# Patient Record
Sex: Female | Born: 1995 | Hispanic: Yes | Marital: Single | Smoking: Former smoker
Health system: Southern US, Community
[De-identification: ages and names within clinical notes are randomized; demographics above are authoritative.]

## PROBLEM LIST (undated history)

## (undated) DIAGNOSIS — F32A Depression, unspecified: Secondary | ICD-10-CM

## (undated) DIAGNOSIS — F329 Major depressive disorder, single episode, unspecified: Secondary | ICD-10-CM

## (undated) DIAGNOSIS — F419 Anxiety disorder, unspecified: Secondary | ICD-10-CM

## (undated) HISTORY — DX: Depression, unspecified: F32.A

## (undated) HISTORY — DX: Anxiety disorder, unspecified: F41.9

## (undated) HISTORY — DX: Major depressive disorder, single episode, unspecified: F32.9

---
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
## 2017-07-12 ENCOUNTER — Encounter: Payer: Self-pay | Admitting: Family Medicine

## 2017-07-12 ENCOUNTER — Ambulatory Visit (INDEPENDENT_AMBULATORY_CARE_PROVIDER_SITE_OTHER): Payer: BLUE CROSS/BLUE SHIELD | Admitting: Family Medicine

## 2017-07-12 VITALS — BP 98/70 | HR 68 | Resp 12 | Ht 65.0 in | Wt 117.0 lb

## 2017-07-12 DIAGNOSIS — N920 Excessive and frequent menstruation with regular cycle: Secondary | ICD-10-CM | POA: Diagnosis not present

## 2017-07-12 DIAGNOSIS — G4700 Insomnia, unspecified: Secondary | ICD-10-CM | POA: Diagnosis not present

## 2017-07-12 DIAGNOSIS — Z114 Encounter for screening for human immunodeficiency virus [HIV]: Secondary | ICD-10-CM

## 2017-07-12 DIAGNOSIS — F339 Major depressive disorder, recurrent, unspecified: Secondary | ICD-10-CM | POA: Insufficient documentation

## 2017-07-12 DIAGNOSIS — F331 Major depressive disorder, recurrent, moderate: Secondary | ICD-10-CM

## 2017-07-12 LAB — CBC
HCT: 42.8 % (ref 36.0–49.0)
Hemoglobin: 14.3 g/dL (ref 12.0–16.0)
MCHC: 33.4 g/dL (ref 31.0–37.0)
MCV: 92.3 fl (ref 78.0–98.0)
Platelets: 185 10*3/uL (ref 150.0–575.0)
RBC: 4.63 Mil/uL (ref 3.80–5.70)
RDW: 12.6 % (ref 11.4–15.5)
WBC: 6 10*3/uL (ref 4.5–13.5)

## 2017-07-12 LAB — TSH: TSH: 2.96 u[IU]/mL (ref 0.40–5.00)

## 2017-07-12 MED ORDER — ESCITALOPRAM OXALATE 10 MG PO TABS: 10.0000 mg | ORAL_TABLET | Freq: Every day | ORAL | 1 refills | Status: DC

## 2017-07-12 MED ORDER — MELATONIN ER 5 MG PO TBCR: 5.0000 mg | EXTENDED_RELEASE_TABLET | Freq: Every day | ORAL | 1 refills | Status: DC

## 2017-07-12 NOTE — Patient Instructions (Signed)
A few things to remember from today's visit:   Menorrhagia with regular cycle - Plan: CBC, TSH  Moderate episode of recurrent major depressive disorder (HCC) - Plan: escitalopram (LEXAPRO) 10 MG tablet  Encounter for screening for HIV - Plan: HIV antibody (with reflex)  Today we started Lexapro, this type of medications can increase suicidal risk. This is more prevalent among children,adolecents, and young adults with major depression or other psychiatric disorders. It can also make depression worse. Most common side effects are gastrointestinal, self limited after a few weeks: diarrhea, nausea, constipation  Or diarrhea among some.  In general it is well tolerated. We will follow closely.  In about 3 weeks please let me know through My Chart or by calling the office about tolerance of new medication.      Please be sure medication list is accurate. If a new problem present, please set up appointment sooner than planned today.

## 2017-07-12 NOTE — Progress Notes (Signed)
HPI:   [name] is a 21 y.o. female, who is here today to establish care.  Former PCP: N/A Last preventive routine visit: 2-3 years ago.  Chronic medical problems: Depression,anxiety.   Concerns today: She would like to discuss pharmacologic treatment for anxiety and depression. According to patient, about 6 months ago she was prescribed 2 medications to help with these problems, she took medication for about a month. Hydroxyzine and Sertraline (recommended for anxiety and depression respectively. She does not recall major side effects except for drowsiness caused by Hydroxyzine. Counseling was recommended but she did not arranged visit due to busy school schedule.  She denies suicidal thoughts but wonders if she was better off dead and if somebody would care is something happens to her. She has not discussed these problems with her parents, according to patient, they don't believe in medications for depression or anxiety.  + Insomnia, she is afraid of darkness, so sleeps with lights on. She states that if she does not have lights on in her room she has more trouble falling asleep and wakes up in the middle of the night seeing bugs crawling on her or other insects in the room. This does not happen when light is on. She denies other type of hallucinations during the day. She usually sleeps about 7 hours, goes to bed between 1:30 am and 2 am while she stays with her parents and around 4 am when she is in school.  She lives with her mother ans younger sister during school breaks. Her parents are divorce, she has a good relation with her mother and sees her father weekly.  No known history of psychiatric problems in her family but she thinks her parents have some anxiety issues.  She is also requesting labs done, states that her sister was recently dx with anemia. + Heavy menses.  She is on OCP's, which she takes for contraception. She is not in a relationship now,  states that she has sexual intercourse/encouters randomly, sometimes with people she just met. Recently Dx with chlamydia vaginitis, treated. She does not use condoms.   In general that she follows a healthy diet and exercises regularly.  Review of Systems  Constitutional: Negative for activity change, appetite change, fatigue, fever and unexpected weight change.  HENT: Negative for mouth sores, nosebleeds and trouble swallowing.   Eyes: Negative for redness and visual disturbance.  Respiratory: Negative for cough, shortness of breath and wheezing.   Cardiovascular: Negative for chest pain, palpitations and leg swelling.  Gastrointestinal: Negative for abdominal pain, nausea and vomiting.       Negative for changes in bowel habits.  Endocrine: Negative for cold intolerance and heat intolerance.  Genitourinary: Negative for decreased urine volume, dysuria, genital sores, hematuria, vaginal bleeding and vaginal discharge.  Musculoskeletal: Negative for arthralgias, gait problem and myalgias.  Skin: Negative for pallor and rash.  Neurological: Negative for syncope, weakness and headaches.  Hematological: Negative for adenopathy. Does not bruise/bleed easily.  Psychiatric/Behavioral: Positive for hallucinations and sleep disturbance. Negative for confusion. The patient is nervous/anxious.     No current outpatient prescriptions on file prior to visit.   No current facility-administered medications on file prior to visit.    Past Medical History:  Diagnosis Date  . Anxiety   . Depression    No Known Allergies  Family History  Problem Relation Age of Onset  . Arthritis Mother   . Diabetes Maternal Grandmother     Social  History   Social History  . Marital status: Single    Spouse name: N/A  . Number of children: N/A  . Years of education: N/A   Social History Main Topics  . Smoking status: Former Research scientist (life sciences)  . Smokeless tobacco: Never Used  . Alcohol use No  . Drug use: No    . Sexual activity: Yes    Birth control/ protection: Pill   Other Topics Concern  . None   Social History Narrative  . None    Vitals:   07/12/17 0729  BP: 98/70  Pulse: 68  Resp: 12   Body mass index is 19.47 kg/m.   Physical Exam  Nursing note and vitals reviewed. Constitutional: She is oriented to person, place, and time. She appears well-developed and well-nourished. No distress.  HENT:  Head: Atraumatic.  Mouth/Throat: Oropharynx is clear and moist and mucous membranes are normal.  Eyes: Pupils are equal, round, and reactive to light. Conjunctivae and EOM are normal.  Neck: No tracheal deviation present. No thyroid mass and no thyromegaly present.  Cardiovascular: Normal rate and regular rhythm.   No murmur heard. Pulses:      Dorsalis pedis pulses are 2+ on the right side, and 2+ on the left side.  Respiratory: Effort normal and breath sounds normal. No respiratory distress.  GI: Soft. She exhibits no mass. There is no hepatomegaly. There is no tenderness.  Musculoskeletal: She exhibits no edema.  Lymphadenopathy:    She has no cervical adenopathy.  Neurological: She is alert and oriented to person, place, and time. She has normal strength. Coordination normal.  Skin: Skin is warm. No erythema.  Psychiatric: Her mood appears anxious. Her affect is labile. She exhibits a depressed mood. She expresses no suicidal ideation. She expresses no suicidal plans.  Well groomed, poor eye contact.    ASSESSMENT AND PLAN:   Carmen Foster was seen today for establish care.  Diagnoses and all orders for this visit:  Lab Results  Component Value Date   WBC 6.0 07/12/2017   HGB 14.3 07/12/2017   HCT 42.8 07/12/2017   MCV 92.3 07/12/2017   PLT 185.0 07/12/2017   Lab Results  Component Value Date   TSH 2.96 07/12/2017    Insomnia, unspecified type  Possible causes discussed, ?anxiety,? Mood disorder (bipolar) among some. Good sleep hygiene recommended. Melatonin ER  recommended for now.  -     Melatonin ER 5 MG TBCR; Take 5 mg by mouth at bedtime.  Menorrhagia with regular cycle  Further recommendations will be given according to lab results. Continue OCP's.  -     CBC -     TSH  Moderate episode of recurrent major depressive disorder ([place])  After discussion of some pharmacologic treatment options, she agrees with trying Lexapro 10 mg. Side effects discussed and instructed about warning signs. Also instructed to monitor for worsening insomnia or impulsive/risky behavior. Because school schedule (goes to [employer]), she was instructed to let me know about new med tolerance and side effects through My chart and to follow in 09/2017 (fall break).  -     escitalopram (LEXAPRO) 10 MG tablet; Take 1 tablet (10 mg total) by mouth daily.  Encounter for screening for HIV  STD prevention discussed.  -     HIV antibody (with reflex)                Dayanis Bergquist G. [place], MD  [facility]. [place] office.

## 2017-07-13 LAB — HIV ANTIBODY (ROUTINE TESTING W REFLEX): HIV: NONREACTIVE

## 2017-08-29 NOTE — Progress Notes (Signed)
HPI:   [name] is a 21 y.o. female, who is here today to follow on recent OV.   She was seen on 07/12/17, c/o depression and anxiety. Lexapro 10 mg was recommended. Because she goes to [employer], I instructed her to let me know about med side effects 6 weeks after med started, she did not. She took Lexapro 1 tab and did not continue. She is not sure why she did not continue medication, she denies side effects, she feels like "it will not work." Symptoms are worse during vacation when back home and better when busier at school.  She is thinking about scheduling counseling through school.  She has not discussed her feeling with her mother, states that "she does not think it is serious." She has not seen her father since last visit.  Stress about financial issues, she does not have money for her rent this month. She has tried to discuss this issue with her father on the phone and her mother but they have not been able to help.  She is working but she is not making enough to cover all her expenses.   Sleeping better, going to bed 11-12 midnight and getting up at 7 am. She didn't take Melatonin.   She denies having suicidal ideation or having a plan but suicidal thoughts have crossed her mind.   Review of Systems  Constitutional: Positive for fatigue. Negative for activity change, appetite change and unexpected weight change.  HENT: Negative for sore throat and trouble swallowing.   Respiratory: Negative for chest tightness, shortness of breath and wheezing.   Cardiovascular: Negative for palpitations and leg swelling.  Gastrointestinal: Negative for abdominal pain, diarrhea, nausea and vomiting.  Endocrine: Negative for cold intolerance and heat intolerance.  Neurological: Negative for dizziness, seizures, syncope, weakness and headaches.  Psychiatric/Behavioral: Positive for sleep disturbance (improved.). Negative for confusion, hallucinations and self-injury. The  patient is nervous/anxious.      Current Outpatient Prescriptions on File Prior to Visit  Medication Sig Dispense Refill  . escitalopram (LEXAPRO) 10 MG tablet Take 1 tablet (10 mg total) by mouth daily. 30 tablet 1  . JUNEL FE 1/20 1-20 MG-MCG tablet Take 1 tablet by mouth daily.  2   No current facility-administered medications on file prior to visit.      Past Medical History:  Diagnosis Date  . Anxiety   . Depression    No Known Allergies  Social History   Social History  . Marital status: Single    Spouse name: N/A  . Number of children: N/A  . Years of education: N/A   Social History Main Topics  . Smoking status: Former Games developer  . Smokeless tobacco: Never Used  . Alcohol use No  . Drug use: No  . Sexual activity: Yes    Birth control/ protection: Pill   Other Topics Concern  . None   Social History Narrative  . None    Vitals:   08/30/17 1006  BP: 100/70  Pulse: 70  Resp: 12  SpO2: 99%   Body mass index is 19.7 kg/m.   Physical Exam  Nursing note and vitals reviewed. Constitutional: She is oriented to person, place, and time. She appears well-developed and well-nourished. No distress.  HENT:  Head: Normocephalic.  Mouth/Throat: Oropharynx is clear and moist and mucous membranes are normal.  Eyes: Pupils are equal, round, and reactive to light. Conjunctivae are normal.  Cardiovascular: Normal rate and regular rhythm.  No murmur heard. Respiratory: Effort normal and breath sounds normal. No respiratory distress.  Musculoskeletal: She exhibits no edema or tenderness.  Lymphadenopathy:    She has no cervical adenopathy.  Neurological: She is alert and oriented to person, place, and time. She has normal strength. Coordination and gait normal.  Skin: Skin is warm. No erythema.  Psychiatric: Her speech is normal. Her affect is labile. Cognition and memory are normal. She expresses no suicidal ideation. She expresses no suicidal plans.  Fairly  groomed, in general good eye contact.    ASSESSMENT AND PLAN:   Deborha was seen today for follow-up.  Diagnoses and all orders for this visit:  Insomnia, unspecified type  Improved. Continue good sleep hygiene.  Moderate episode of recurrent major depressive disorder (HCC)  She denies suicidal ideations or having a plan. We discussed some options, she would like to try Lexapro. Strongly recommend schedule counseling through school. We clearly discussed side effects of Lexapro and she understands if suicidal thoughts become more frequent or stronger she needs to seek immediate medical attention (ER).     25 min face to face OV. > 50% was dedicated to discussion of Dx, prognosis, treatment options, and some side effects of medication. We also discussed some options in regard to strategies to deal with family members, who according to patient, do not understand her, she does not feel supported. Also encouraged to seek counseling at [employer], they could also provide information about resources and/or options for housing. I will see her in 4 weeks (next school brake),before if needed. She voices understanding about warning signs, in which case she will call 911 or go to the ER.      Betty G. [place], MD  [facility]. [place] office.

## 2017-08-30 ENCOUNTER — Encounter: Payer: Self-pay | Admitting: Family Medicine

## 2017-08-30 ENCOUNTER — Ambulatory Visit (INDEPENDENT_AMBULATORY_CARE_PROVIDER_SITE_OTHER): Payer: BLUE CROSS/BLUE SHIELD | Admitting: Family Medicine

## 2017-08-30 VITALS — BP 100/70 | HR 70 | Resp 12 | Ht 65.0 in | Wt 118.4 lb

## 2017-08-30 DIAGNOSIS — G4700 Insomnia, unspecified: Secondary | ICD-10-CM

## 2017-08-30 DIAGNOSIS — F331 Major depressive disorder, recurrent, moderate: Secondary | ICD-10-CM

## 2017-08-30 NOTE — Patient Instructions (Signed)
A few things to remember from today's visit:   Insomnia, unspecified type  Moderate episode of recurrent major depressive disorder (HCC)  Today we started Lexapro, this type of medications can increase suicidal risk. This is more prevalent among children,adolecents, and young adults with major depression or other psychiatric disorders. It can also make depression worse. Most common side effects are gastrointestinal, self limited after a few weeks: diarrhea, nausea, constipation  Or diarrhea among some.  In general it is well tolerated. We will follow closely.      Please be sure medication list is accurate. If a new problem present, please set up appointment sooner than planned today.

## 2017-10-04 ENCOUNTER — Ambulatory Visit: Payer: BLUE CROSS/BLUE SHIELD | Admitting: Family Medicine

## 2018-05-16 ENCOUNTER — Ambulatory Visit (INDEPENDENT_AMBULATORY_CARE_PROVIDER_SITE_OTHER): Payer: Self-pay | Admitting: Family Medicine

## 2018-05-16 ENCOUNTER — Encounter: Payer: Self-pay | Admitting: Family Medicine

## 2018-05-16 VITALS — BP 110/78 | HR 67 | Temp 98.3°F | Ht 65.0 in | Wt 124.0 lb

## 2018-05-16 DIAGNOSIS — N760 Acute vaginitis: Secondary | ICD-10-CM

## 2018-05-16 DIAGNOSIS — J019 Acute sinusitis, unspecified: Secondary | ICD-10-CM

## 2018-05-16 MED ORDER — DOXYCYCLINE HYCLATE 100 MG PO CAPS: 100.0000 mg | ORAL_CAPSULE | Freq: Two times a day (BID) | ORAL | 0 refills | Status: AC

## 2018-05-16 NOTE — Progress Notes (Signed)
   Subjective:    Patient ID: Carmen Foster, female    DOB: [birth year], 22 y.o.   MRN: [id number]  HPI Here for several issues. First she has had sinus congestion for about 6 weeks, and she is blowing yellow mucus from the nose. She is coughing up yellow mucus. No fever. Also for one month she has had an intermittent vaginal DC which is somewhat clear. There is no itching and no odor. She denies any abdominal or pelvic pain. No UTI symptoms. She was treated for Chlamydia last fall while in school [facility] [employer] and she had a Pap smear at that time. Her menses are regular and her LMP started on 04-30-18 as expected. She has stopped taking BCP due to cost concerns. She has been sexually active with several partners in the past few months, and she says they always use condoms.    Review of Systems  Constitutional: Negative.   Respiratory: Negative.   Cardiovascular: Negative.   Gastrointestinal: Negative for abdominal distention, abdominal pain, constipation, diarrhea, nausea and vomiting.  Genitourinary: Positive for vaginal discharge. Negative for pelvic pain, vaginal bleeding and vaginal pain.       Objective:   Physical Exam  Constitutional: She appears well-developed and well-nourished. No distress.  HENT:  Right Ear: External ear normal.  Left Ear: External ear normal.  Nose: Nose normal.  Mouth/Throat: Oropharynx is clear and moist.  Eyes: Conjunctivae are normal.  Neck: No thyromegaly present.  Pulmonary/Chest: Effort normal. No stridor. No respiratory distress. She has no rales.  Soft scattered wheezes   Abdominal: Soft. Bowel sounds are normal. She exhibits no distension and no mass. There is no tenderness. There is no rebound and no guarding. No hernia.  Lymphadenopathy:    She has no cervical adenopathy.          Assessment & Plan:  She has a sinusitis and we will treat this with 10 days of Doxycycline. She can add Mucinex prn. For the vaginal DC we  will check for Chlamydia and gonorrhea. She does not want any further testing today because of cost concerns. She does not have medical insurance.  Gershon CraneStephen Fry, MD

## 2018-05-17 LAB — C. TRACHOMATIS/N. GONORRHOEAE RNA
C. trachomatis RNA, TMA: NOT DETECTED
N. gonorrhoeae RNA, TMA: NOT DETECTED

## 2019-11-27 ENCOUNTER — Other Ambulatory Visit: Payer: Self-pay

## 2019-11-27 ENCOUNTER — Encounter (HOSPITAL_COMMUNITY): Payer: Self-pay | Admitting: Emergency Medicine

## 2019-11-27 ENCOUNTER — Ambulatory Visit (HOSPITAL_COMMUNITY)
Admission: EM | Admit: 2019-11-27 | Discharge: 2019-11-27 | Disposition: A | Discharge status: Home / Self Care | Payer: BLUE CROSS/BLUE SHIELD | Source: Home / Self Care | Attending: Internal Medicine | Admitting: Internal Medicine

## 2019-11-27 ENCOUNTER — Emergency Department (HOSPITAL_COMMUNITY)
Admission: EM | Admit: 2019-11-27 | Discharge: 2019-11-27 | Disposition: A | Discharge status: Home / Self Care | Payer: BLUE CROSS/BLUE SHIELD | Attending: Emergency Medicine | Admitting: Emergency Medicine

## 2019-11-27 ENCOUNTER — Telehealth: Payer: Self-pay

## 2019-11-27 ENCOUNTER — Emergency Department (HOSPITAL_COMMUNITY): Payer: BLUE CROSS/BLUE SHIELD

## 2019-11-27 DIAGNOSIS — R197 Diarrhea, unspecified: Secondary | ICD-10-CM | POA: Insufficient documentation

## 2019-11-27 DIAGNOSIS — Z87891 Personal history of nicotine dependence: Secondary | ICD-10-CM | POA: Diagnosis not present

## 2019-11-27 DIAGNOSIS — R1032 Left lower quadrant pain: Secondary | ICD-10-CM

## 2019-11-27 DIAGNOSIS — K921 Melena: Secondary | ICD-10-CM | POA: Diagnosis present

## 2019-11-27 LAB — COMPREHENSIVE METABOLIC PANEL
ALT: 16 U/L (ref 0–44)
AST: 19 U/L (ref 15–41)
Albumin: 4.2 g/dL (ref 3.5–5.0)
Alkaline Phosphatase: 64 U/L (ref 38–126)
Anion gap: 11 (ref 5–15)
BUN: 6 mg/dL (ref 6–20)
CO2: 21 mmol/L — ABNORMAL LOW (ref 22–32)
Calcium: 9 mg/dL (ref 8.9–10.3)
Chloride: 105 mmol/L (ref 98–111)
Creatinine, Ser: 0.87 mg/dL (ref 0.44–1.00)
GFR calc Af Amer: >60 mL/min (ref >60)
GFR calc non Af Amer: >60 mL/min (ref >60)
Glucose, Bld: 84 mg/dL (ref 70–99)
Potassium: 3.5 mmol/L (ref 3.5–5.1)
Sodium: 137 mmol/L (ref 135–145)
Total Bilirubin: 0.6 mg/dL (ref 0.3–1.2)
Total Protein: 7.4 g/dL (ref 6.5–8.1)

## 2019-11-27 LAB — POC URINE PREG, ED: Preg Test, Ur: NEGATIVE

## 2019-11-27 LAB — CBC WITH DIFFERENTIAL/PLATELET
Abs Immature Granulocytes: 0.03 10*3/uL (ref 0.00–0.07)
Basophils Absolute: 0 10*3/uL (ref 0.0–0.1)
Basophils Relative: 1 %
Eosinophils Absolute: 0 10*3/uL (ref 0.0–0.5)
Eosinophils Relative: 0 %
HCT: 47.9 % — ABNORMAL HIGH (ref 36.0–46.0)
Hemoglobin: 16 g/dL — ABNORMAL HIGH (ref 12.0–15.0)
Immature Granulocytes: 1 %
Lymphocytes Relative: 23 %
Lymphs Abs: 1.2 10*3/uL (ref 0.7–4.0)
MCH: 30.7 pg (ref 26.0–34.0)
MCHC: 33.4 g/dL (ref 30.0–36.0)
MCV: 91.8 fL (ref 80.0–100.0)
Monocytes Absolute: 0.6 10*3/uL (ref 0.1–1.0)
Monocytes Relative: 11 %
Neutro Abs: 3.5 10*3/uL (ref 1.7–7.7)
Neutrophils Relative %: 64 %
Platelets: 146 10*3/uL — ABNORMAL LOW (ref 150–400)
RBC: 5.22 MIL/uL — ABNORMAL HIGH (ref 3.87–5.11)
RDW: 12 % (ref 11.5–15.5)
WBC: 5.4 10*3/uL (ref 4.0–10.5)
nRBC: 0 % (ref 0.0–0.2)

## 2019-11-27 LAB — SEDIMENTATION RATE: Sed Rate: 6 mm/hr (ref 0–22)

## 2019-11-27 LAB — C-REACTIVE PROTEIN: CRP: 0.7 mg/dL (ref <1.0)

## 2019-11-27 MED ORDER — CIPROFLOXACIN HCL 500 MG PO TABS: 500.0000 mg | ORAL_TABLET | Freq: Two times a day (BID) | ORAL | 0 refills | Status: AC

## 2019-11-27 MED ORDER — IOHEXOL 300 MG/ML  SOLN
100.0000 mL | Freq: Once | INTRAMUSCULAR | Status: AC | PRN
  Administered 2019-11-27: 100 mL via INTRAVENOUS

## 2019-11-27 MED ORDER — SODIUM CHLORIDE 0.9 % IV BOLUS
1000.0000 mL | Freq: Once | INTRAVENOUS | Status: AC
  Administered 2019-11-27: 23:00:00 1000 mL via INTRAVENOUS

## 2019-11-27 MED ORDER — ACETAMINOPHEN 500 MG PO TABS
1000.0000 mg | ORAL_TABLET | Freq: Once | ORAL | Status: AC
  Administered 2019-11-27: 23:00:00 1000 mg via ORAL
  Filled 2019-11-27: qty 2

## 2019-11-27 MED ORDER — CIPROFLOXACIN IN D5W 400 MG/200ML IV SOLN
400.0000 mg | Freq: Once | INTRAVENOUS | Status: AC
  Administered 2019-11-27: 23:00:00 400 mg via INTRAVENOUS
  Filled 2019-11-27: qty 200

## 2019-11-27 NOTE — ED Triage Notes (Signed)
Pt. Stated, I have been to [facility] and told to come here for further evaluation. Im having stomach pain and blood in my stool. . This stared yesterday morning.

## 2019-11-27 NOTE — ED Provider Notes (Signed)
[facility] EMERGENCY DEPARTMENT Provider Note   CSN: [id number] Arrival date & time: 11/27/19  1712     History Chief Complaint  Patient presents with  . Abdominal Pain  . Blood In Stools    Carmen Foster is a 23 y.o. female.  23 y.o female with a PMH of Anxiety, depression is the ED with a chief complaint of abdominal pain, diarrhea for the past day.  Patient reports her symptoms began yesterday, she had about 6 episodes of diarrhea, then noted to have fever along with chills.  She reports noting blood in her stools, there was a significant amount of clots in the toilet according to patient.  She reports taking some nighttime medication for cold and flu without improvement in her symptoms.  She was seen by urgent care this evening, was referred to the ED for further evaluation of her bloody diarrhea.  She does not have any prior history of IBS.  Her last menstrual period was November 04, 2019.  She is currently sexually active.  She endorses pain along the lower aspect of her abdomen, described as a shooting sensation that waxes and wanes, no alleviating or exacerbating factors.  No she did mention somewhat relieved after defecation.  No urinary symptoms, past surgical history of her abdomen, sick exposures.No gynecological complaints.   The history is provided by the patient.  Abdominal Pain Associated symptoms: diarrhea   Associated symptoms: no chest pain, no fever, no nausea, no shortness of breath, no sore throat and no vomiting        Past Medical History:  Diagnosis Date  . Anxiety   . Depression     Patient Active Problem List   Diagnosis Date Noted  . Depression, major, recurrent ([place]) 07/12/2017  . Insomnia 07/12/2017    History reviewed. No pertinent surgical history.   OB History   No obstetric history on file.     Family History  Problem Relation Age of Onset  . Arthritis Mother   . Diabetes Maternal Grandmother     Social  History   Tobacco Use  . Smoking status: Former Research scientist (life sciences)  . Smokeless tobacco: Never Used  Substance Use Topics  . Alcohol use: No  . Drug use: No    Home Medications Prior to Admission medications   Not on File    Allergies    Patient has no known allergies.  Review of Systems   Review of Systems  Constitutional: Negative for fever.  HENT: Negative for sore throat.   Respiratory: Negative for shortness of breath.   Cardiovascular: Negative for chest pain.  Gastrointestinal: Positive for abdominal pain and diarrhea. Negative for nausea and vomiting.  Genitourinary: Negative for flank pain.  Musculoskeletal: Negative for back pain.  Skin: Negative for pallor and wound.  Neurological: Negative for light-headedness and headaches.    Physical Exam Updated Vital Signs BP 124/90 (BP Location: Left Arm)   Pulse (!) 112   Temp 99.8 F (37.7 C) (Oral)   Resp 16   LMP 11/03/2019 (Approximate) Comment: Negative u-Peg today in ED  SpO2 100%   Physical Exam Vitals and nursing note reviewed.  Constitutional:      General: She is not in acute distress.    Appearance: She is well-developed.  HENT:     Head: Normocephalic and atraumatic.     Mouth/Throat:     Pharynx: No oropharyngeal exudate.  Eyes:     Pupils: Pupils are equal, round, and reactive to light.  Cardiovascular:     Rate and Rhythm: Regular rhythm. Tachycardia present.     Heart sounds: Normal heart sounds.  Pulmonary:     Effort: Pulmonary effort is normal. No respiratory distress.     Breath sounds: Normal breath sounds.  Abdominal:     General: Bowel sounds are normal. There is no distension.     Palpations: Abdomen is soft.     Tenderness: There is abdominal tenderness in the right lower quadrant, suprapubic area and left lower quadrant. There is no right CVA tenderness or left CVA tenderness.     Comments: No tenderness to palpation along the lower abdominal region.  More so left lower quadrant.    Musculoskeletal:        General: No tenderness or deformity.     Cervical back: Normal range of motion.     Right lower leg: No edema.     Left lower leg: No edema.  Skin:    General: Skin is warm and dry.  Neurological:     Mental Status: She is alert and oriented to person, place, and time.     ED Results / Procedures / Treatments   Labs (all labs ordered are listed, but only abnormal results are displayed) Labs Reviewed  POC URINE PREG, ED    EKG None  Radiology No results found.  Procedures Procedures (including critical care time)  Medications Ordered in ED Medications  iohexol (OMNIPAQUE) 300 MG/ML solution 100 mL (100 mLs Intravenous Contrast Given 11/27/19 2157)    ED Course  I have reviewed the triage vital signs and the nursing notes.  Pertinent labs & imaging results that were available during my care of the patient were reviewed by me and considered in my medical decision making (see chart for details).    MDM Rules/Calculators/A&P  Patient with no pertinent past medical history presents to the ED with complaints of bloody diarrhea since yesterday.  She reports having about 6 episodes, noting clots on the toilet after her bowel movements.  States she also endorses some fever and chills, unknown on temperature as she does not have a thermometer.  She was sent here by urgent care, previously seen earlier for bloody stools, labs were drawn at urgent care.  These resulted with CBC without any leukocytosis, hemoglobin is elevated at 16.0, no signs of anemia.  CMP without any electrolyte abnormality, creatinine level is within normal limits.  Monitoring markers such as sed rate, C-reactive proteins are within normal limits.  During my evaluation patient appears uncomfortable, there is mild tenderness to palpation along the suprapubic region, more so left-sided.  She is had no nausea, episodes of vomiting.  She did arrive in the ED tachycardic at 112, suspect this  this is likely due to dehydration.  Unfortunately due to patient HCG lab pending CT abdomen has been delayed. Patient care signed onto incoming team pending CT results and disposition accordingly.    Portions of this note were generated with Scientist, clinical (histocompatibility and immunogenetics). Dictation errors may occur despite best attempts at proofreading.  Final Clinical Impression(s) / ED Diagnoses Final diagnoses:  Bloody stools    Rx / DC Orders ED Discharge Orders    None       Claude Manges, Cordelia Poche 11/27/19 2211    Milagros Loll, MD 11/29/19 [phone]

## 2019-11-27 NOTE — ED Triage Notes (Signed)
Pt reports diarrhea with a blood in it since yesterday.  She states it is as much blood as she would have with her period.  She also reports lower abdominal cramping when she has to have a bowel movement.

## 2019-11-27 NOTE — ED Notes (Signed)
Patient is being discharged from the [facility]gent [facility] and sent to the Emergency Department via wheelchair by staff. Per dr Lanny Cramp, patient is stable but in need of higher level of care due to loose bloody stools. Patient is aware and verbalizes understanding of plan of care.  Vitals:   11/27/19 1612  BP: 118/90  Pulse: (!) 109  Resp: 18  Temp: 99.8 F (37.7 C)  SpO2: 99%

## 2019-11-27 NOTE — Discharge Instructions (Signed)
Your CT showed colitis, which is inflammation or infection of the colon.  Given that you had bloody diarrhea, we will treat this with antibiotics.  If you gave a stool sample, we will send it for culture and call you if we need to change your antibiotics.  Your CT scan had an incidental finding of a uterine fibroid.  This was not seen in great detail on the CT.  It is something that you should discuss with your doctor.  You may need to have additional imaging such as an ultrasound scheduled in the future.

## 2019-11-27 NOTE — ED Provider Notes (Addendum)
[place]    CSN: [id number] Arrival date & time: 11/27/19  1445      History   Chief Complaint Chief Complaint  Patient presents with  . Blood In Stools  . Diarrhea    HPI Carmen Foster is a 23 y.o. female comes to urgent care with a history of diarrhea of 2 days duration.  Patient says that the bowel movements were without blood initially.  This morning patient has bloody diarrhea.  Stool is mixed with blood.  Blood is bright red.  No history of hemorrhoids.  No sensation of mass in the perirectal area.  Abdominal pain is in the suprapubic and left lower quadrant region.  It is intermittent, crampy in nature.  No known relieving factors.  No known aggravating factors.  Patient complains of rash over the dorsum of both hands which he notes is just recently.  No family history of irritable bowel disease.  This is the first episode of such symptoms that she has had.  No joint pains or aches.  Patient has no change in dietary habits.  She works at [employer].  At the height of the pain it is 10 out of 10.   HPI  Past Medical History:  Diagnosis Date  . Anxiety   . Depression     Patient Active Problem List   Diagnosis Date Noted  . 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 Insomnia 07/12/2017    History reviewed. No pertinent surgical history.  OB History   No obstetric history on file.      Home Medications    Prior to Admission medications   Not on File    Family History Family History  Problem Relation Age of Onset  . Arthritis Mother   . Diabetes Maternal Grandmother     Social History Social History   Tobacco Use  . Smoking status: Former Research scientist (life sciences)  . Smokeless tobacco: Never Used  Substance Use Topics  . Alcohol use: No  . Drug use: No     Allergies   Patient has no known allergies.   Review of Systems Review of Systems  Constitutional: Negative for activity change, chills, fatigue and fever.  HENT: Negative.     Respiratory: Negative.  Negative for cough and shortness of breath.   Cardiovascular: Negative.  Negative for chest pain and palpitations.  Gastrointestinal: Positive for abdominal pain, blood in stool, diarrhea and rectal pain. Negative for abdominal distention, nausea and vomiting.  Genitourinary: Negative for dysuria, frequency, genital sores, urgency and vaginal bleeding.  Musculoskeletal: Negative.   Neurological: Negative.  Negative for dizziness, weakness, light-headedness and headaches.  Psychiatric/Behavioral: Negative for confusion and decreased concentration.  All other systems reviewed and are negative.    Physical Exam Triage Vital Signs ED Triage Vitals  Enc Vitals Group     BP      Pulse      Resp      Temp      Temp src      SpO2      Weight      Height      Head Circumference      Peak Flow      Pain Score      Pain Loc      Pain Edu?      Excl. in [place]?    No data found.  Updated Vital Signs BP 118/90 (BP Location: Right Arm)   Pulse (!) 109   Temp 99.8 F (37.7  C) (Oral)   Resp 18   LMP 11/03/2019 (Approximate)   SpO2 99%   Visual Acuity Right Eye Distance:   Left Eye Distance:   Bilateral Distance:    Right Eye Near:   Left Eye Near:    Bilateral Near:     Physical Exam Vitals and nursing note reviewed.  Constitutional:      Appearance: She is not ill-appearing or toxic-appearing.  Cardiovascular:     Rate and Rhythm: Normal rate and regular rhythm.     Pulses: Normal pulses.     Heart sounds: Normal heart sounds. No murmur. No friction rub.  Pulmonary:     Effort: Pulmonary effort is normal.     Breath sounds: Normal breath sounds. No wheezing or rhonchi.  Chest:     Chest wall: No tenderness.  Abdominal:     General: Bowel sounds are normal. There is no distension.     Palpations: There is no mass.     Tenderness: There is abdominal tenderness. There is no guarding or rebound.     Hernia: No hernia is present.  Musculoskeletal:         General: No swelling or signs of injury. Normal range of motion.     Right lower leg: No edema.  Skin:    General: Skin is warm.     Capillary Refill: Capillary refill takes less than 2 seconds.     Coloration: Skin is not jaundiced.     Findings: No bruising or erythema.  Neurological:     General: No focal deficit present.     Mental Status: She is alert and oriented to person, place, and time.  Psychiatric:        Mood and Affect: Mood normal.        Behavior: Behavior normal.      UC Treatments / Results  Labs (all labs ordered are listed, but only abnormal results are displayed) Labs Reviewed  CBC WITH DIFFERENTIAL/PLATELET  COMPREHENSIVE METABOLIC PANEL  SEDIMENTATION RATE  C-REACTIVE PROTEIN    EKG   Radiology No results found.  Procedures Procedures (including critical care time)  Medications Ordered in UC Medications - No data to display  Initial Impression / Assessment and Plan / UC Course  I have reviewed the triage vital signs and the nursing notes.  Pertinent labs & imaging results that were available during my care of the patient were reviewed by me and considered in my medical decision making (see chart for details).     1.  Bloody diarrhea with left lower quadrant abdominal pain concerning for infectious diarrhea versus inflammatory bowel disease: CBC, CMP, ESR and CRP were drawn Patient continues to have intermittent crampy abdominal pain Patient will benefit from CT scan given that she has left lower quadrant abdominal pain in the setting of bloody diarrhea. Final Clinical Impressions(s) / UC Diagnoses   Final diagnoses:  Bloody diarrhea  LLQ abdominal pain   Discharge Instructions   None    ED Prescriptions    None     PDMP not reviewed this encounter.   Chase Picket, MD 11/27/19 1700    Chase Picket, MD 11/27/19 1700

## 2019-11-27 NOTE — ED Triage Notes (Signed)
Labs were completed at [facility]

## 2019-11-27 NOTE — ED Notes (Signed)
Patient transported to CT 

## 2019-11-27 NOTE — Telephone Encounter (Signed)
Copied from [place] [phone]. Topic: Appointment Scheduling - Scheduling Inquiry for Clinic >> Nov 27, 2019  2:00 PM Carmen Foster wrote: Reason for CRM: Patient called to set up an appt for tomorrow [phone] . Pt states she has blood in her stool . Please advise

## 2019-11-27 NOTE — ED Provider Notes (Signed)
Patient signed out to me at shift change.  Patient sent to ED from [facility] for abdominal pain and bloody diarrhea.  CT pending.  Will give some fluid as patient is a little tachycardic.  Possibly dry, her HGB looks a little hemo concentrated. Will add stool pathogen panel.    Upreg not crossing in Epic, but is negative.  Visualized be me and Estate manager/land agent.  CT shows colitis.  Will cover with Cipro for presumed infectious diarrhea.  Patient adds that she ate sushi 2 nights ago.     Montine Circle, PA-C 11/28/19 0011    Lucrezia Starch, MD 11/29/19 [phone]

## 2019-11-27 NOTE — Telephone Encounter (Signed)
Spoke to pt and she stated she was at the ED to be evaluated due to not having any appt. In office. Pt was advised to call back if she needed anything. Pt verbalized understanding.

## 2019-12-01 LAB — GI PATHOGEN PANEL BY PCR, STOOL

## 2021-05-27 IMAGING — CT CT ABD-PELV W/ CM
2 of 4 series · 15 of 46 positions shown, 17 images · IV contrast (APPLIED)
Comparison: None.

CLINICAL DATA: Acute generalized abdominal pain. Diarrhea. Blood in
stools.

EXAM:
CT ABDOMEN AND PELVIS WITH CONTRAST
TECHNIQUE: Multidetector CT imaging of the abdomen and pelvis was performed
using the standard protocol following bolus administration of
intravenous contrast.
CONTRAST:  100mL OMNIPAQUE IOHEXOL 300 MG/ML  SOLN

[Series 3: abdomen 5.0 · axial · 0.82mm/px · z∈[-424,-30]mm · 12 of 89 slices shown, 14 images]
[im 5/89  soft-tissue]
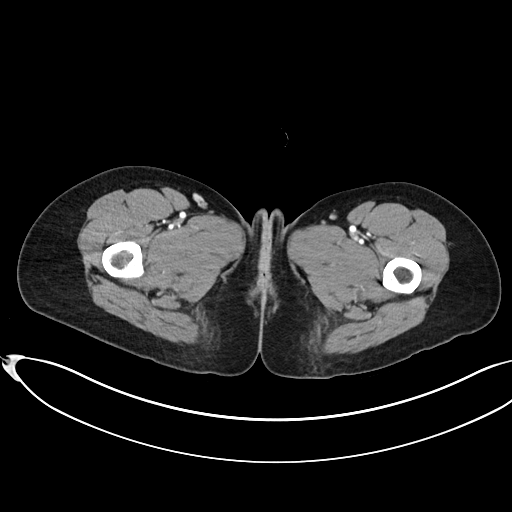
[im 5/89  bone]
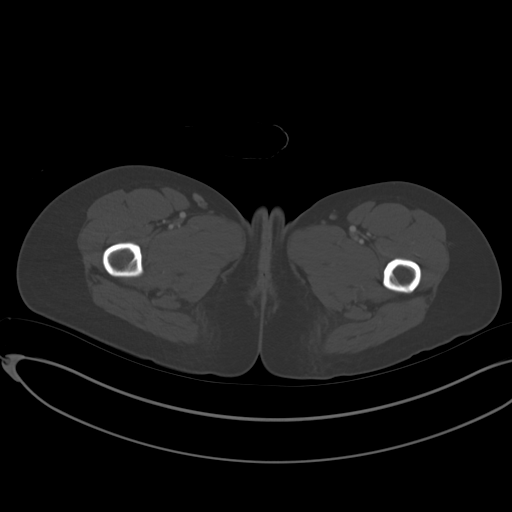
[im 14/89  soft-tissue]
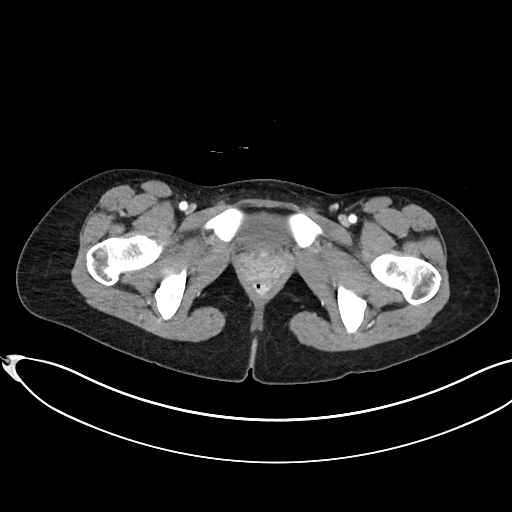
[im 19/89  soft-tissue]
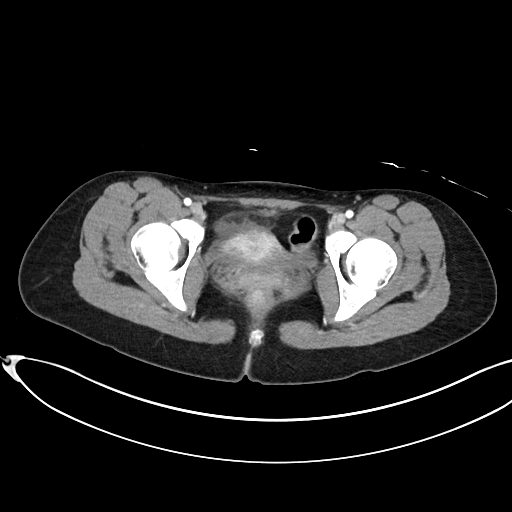
[im 28/89  soft-tissue]
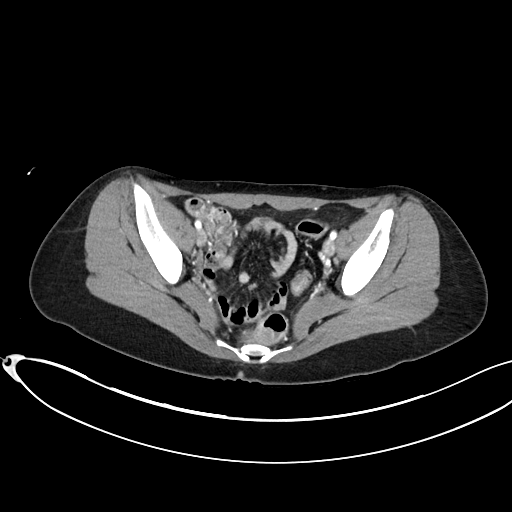
[im 33/89  soft-tissue]
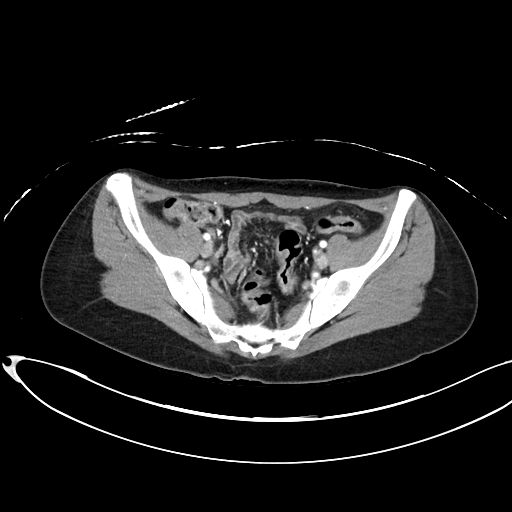
[im 42/89  soft-tissue]
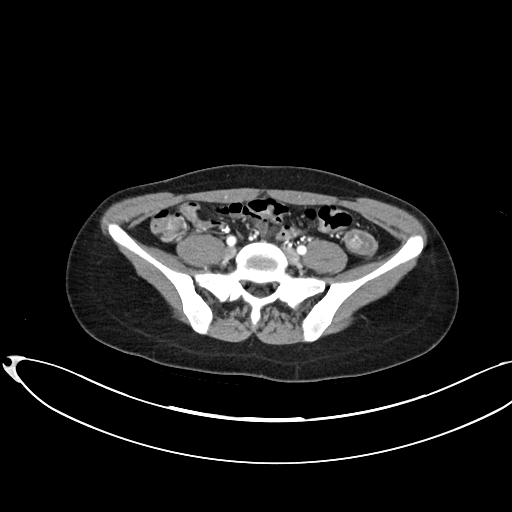
[im 47/89  soft-tissue]
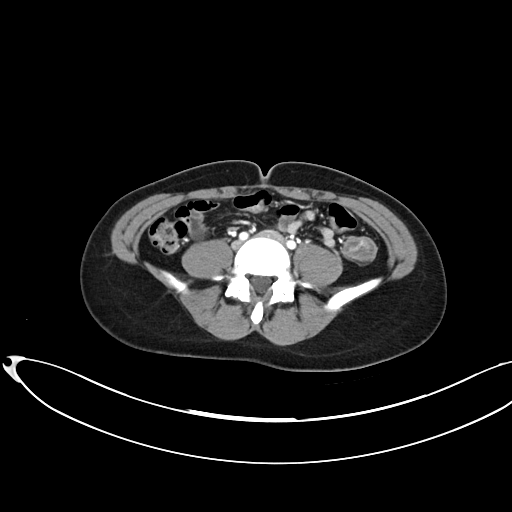
[im 56/89  soft-tissue]
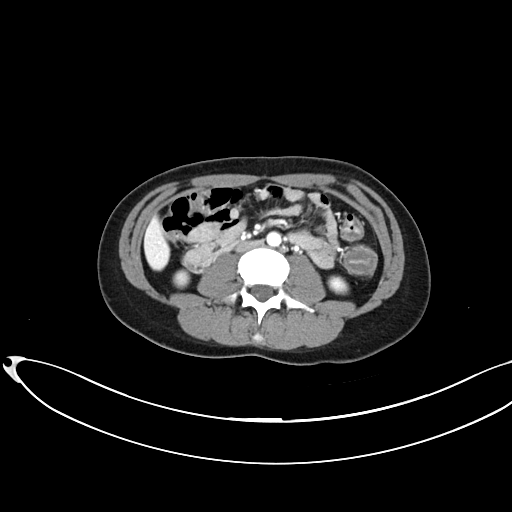
[im 61/89  soft-tissue]
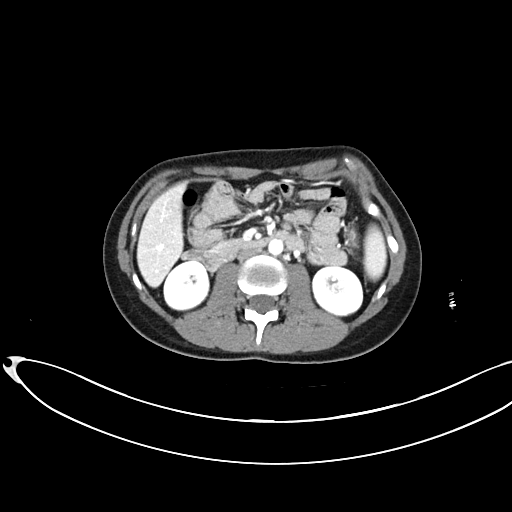
[im 61/89  bone]
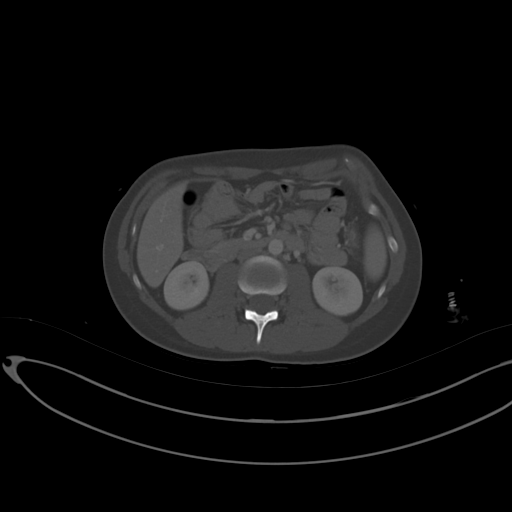
[im 70/89  soft-tissue]
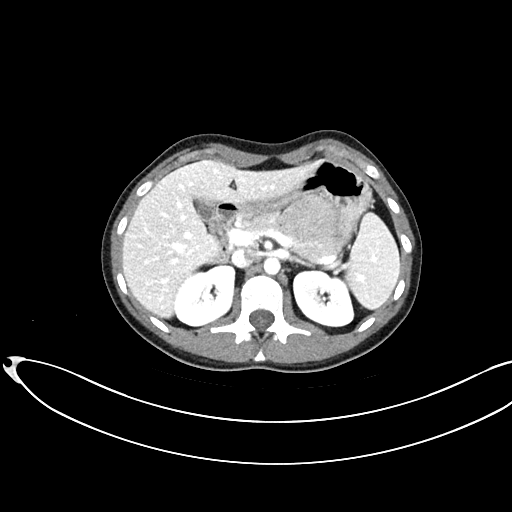
[im 75/89  soft-tissue]
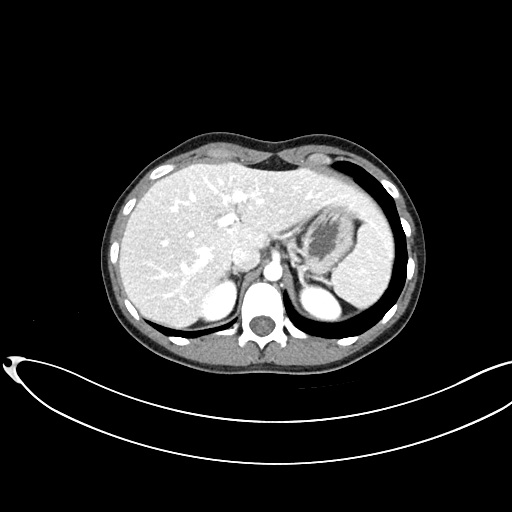
[im 84/89  soft-tissue]
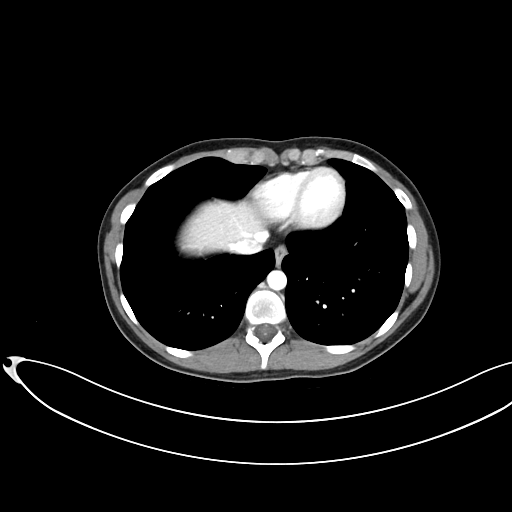

[Series 6: abdomen 3.0 mpr cor · coronal · 0.69mm/px · 3 of 67 slices shown]
[im 23/67  soft-tissue]
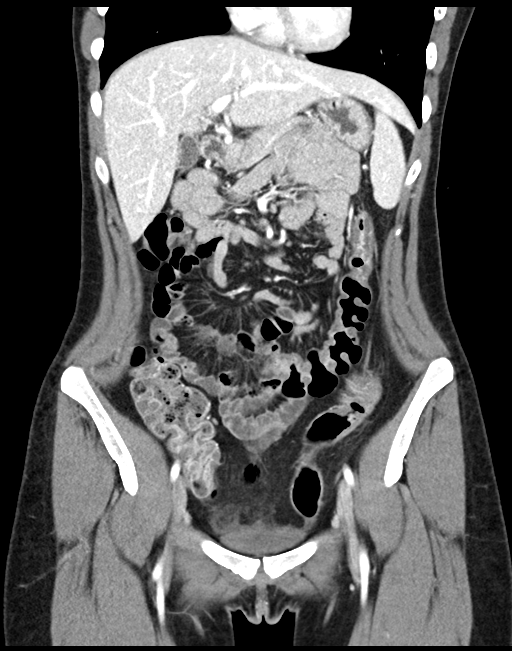
[im 30/67  soft-tissue]
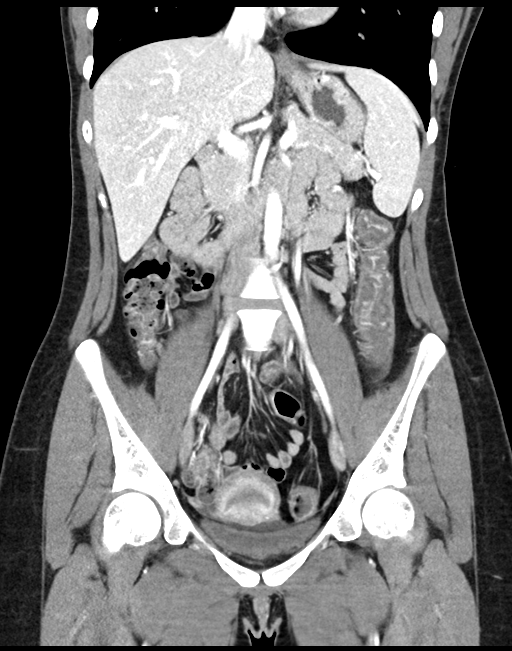
[im 37/67  soft-tissue]
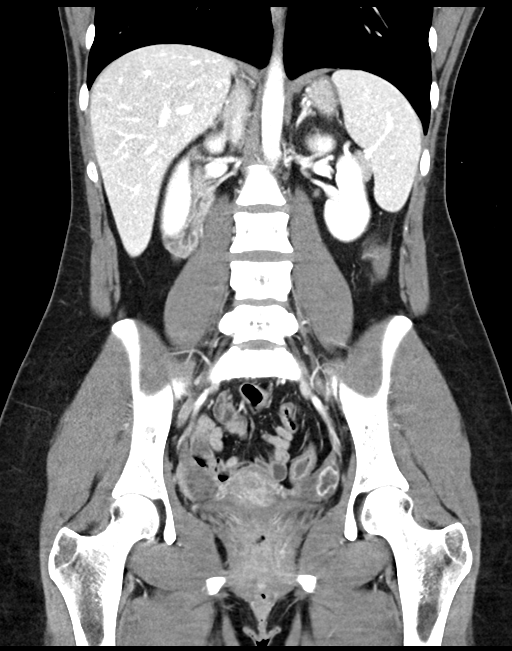

[15 of 46 positions shown; findings below may reference images not displayed]

FINDINGS: Lower chest: The lung bases are clear. Heart size is normal.

Hepatobiliary: No focal liver abnormality is seen. No gallstones,
gallbladder wall thickening, or biliary dilatation.

Pancreas: No ductal dilatation or inflammation. Pancreatic tail
slightly truncated.

Spleen: Normal in size without focal abnormality.

Adrenals/Urinary Tract: Normal adrenal glands. No hydronephrosis or
perinephric edema. Homogeneous renal enhancement. Urinary bladder is
nondistended and not well evaluated.

Stomach/Bowel: Colonic wall thickening with pericolonic edema
extending from the splenic flexure through the proximal sigmoid
colon consistent with colitis. Mild associated mucosal hyperemia.
Mild hyperemia of the distal sigmoid colon without wall thickening.
Small volume of formed stool in the more proximal colon. Normal
appendix. Few fluid-filled distal small bowel loops without
obstruction or inflammation. Terminal ileum is normal. Proximal
small bowel is decompressed. The stomach is unremarkable. Normal
positioning of the ligament of Treitz.

Vascular/Lymphatic: Abdominal aorta is normal in caliber. Portal
vein is patent. The mesenteric vessels are patent. No enlarged lymph
nodes in the abdomen or pelvis.

Reproductive: Peripherally enhancing structure in the uterine fundus
is likely a fibroid, but nonspecific by CT. Small physiologic corpus
luteal cyst in the left ovary. Right ovary appears normal. No
suspicious adnexal mass.

Other: Small amount of free fluid in the pelvis. No free air or
intra-abdominal abscess.

Musculoskeletal: There are no acute or suspicious osseous
abnormalities.
IMPRESSION: 1. Colitis extending from the splenic flexure through the proximal
sigmoid colon, likely infectious or inflammatory.
2. Enhancing structure in the uterine fundus may be a fibroid, but
is nonspecific in CT appearance. Consider further evaluation with
pelvic ultrasound on an elective basis.
